# Patient Record
Sex: Female | Born: 1955 | Race: White | Hispanic: No | Marital: Married
Health system: Southern US, Community
[De-identification: ages and names within clinical notes are randomized; demographics above are authoritative.]

## PROBLEM LIST (undated history)

## (undated) DIAGNOSIS — E7800 Pure hypercholesterolemia, unspecified: Secondary | ICD-10-CM

## (undated) DIAGNOSIS — F419 Anxiety disorder, unspecified: Secondary | ICD-10-CM

---

## 2008-11-15 ENCOUNTER — Ambulatory Visit: Payer: Self-pay | Admitting: Family Medicine

## 2015-03-20 ENCOUNTER — Ambulatory Visit
Admit: 2015-03-20 | Disposition: A | Discharge status: Home / Self Care | Payer: Self-pay | Attending: Family Medicine | Admitting: Family Medicine

## 2015-04-08 ENCOUNTER — Ambulatory Visit
Admit: 2015-04-08 | Disposition: A | Discharge status: Home / Self Care | Payer: Self-pay | Attending: Family Medicine | Admitting: Family Medicine

## 2015-08-06 ENCOUNTER — Encounter: Payer: Self-pay | Admitting: *Deleted

## 2015-08-07 ENCOUNTER — Ambulatory Visit: Payer: Managed Care, Other (non HMO) | Admitting: Anesthesiology

## 2015-08-07 ENCOUNTER — Ambulatory Visit
Admission: RE | Admit: 2015-08-07 | Discharge: 2015-08-07 | Disposition: A | Discharge status: Home / Self Care | Payer: Managed Care, Other (non HMO) | Source: Ambulatory Visit | Attending: Gastroenterology | Admitting: Gastroenterology

## 2015-08-07 ENCOUNTER — Encounter: Payer: Self-pay | Admitting: Anesthesiology

## 2015-08-07 ENCOUNTER — Encounter
Admission: RE | Disposition: A | Discharge status: Home / Self Care | Payer: Self-pay | Source: Ambulatory Visit | Attending: Gastroenterology

## 2015-08-07 DIAGNOSIS — Z1211 Encounter for screening for malignant neoplasm of colon: Secondary | ICD-10-CM | POA: Diagnosis present

## 2015-08-07 DIAGNOSIS — Z79899 Other long term (current) drug therapy: Secondary | ICD-10-CM | POA: Diagnosis not present

## 2015-08-07 DIAGNOSIS — F419 Anxiety disorder, unspecified: Secondary | ICD-10-CM | POA: Insufficient documentation

## 2015-08-07 DIAGNOSIS — K640 First degree hemorrhoids: Secondary | ICD-10-CM | POA: Insufficient documentation

## 2015-08-07 DIAGNOSIS — E780 Pure hypercholesterolemia: Secondary | ICD-10-CM | POA: Diagnosis not present

## 2015-08-07 DIAGNOSIS — Z7982 Long term (current) use of aspirin: Secondary | ICD-10-CM | POA: Insufficient documentation

## 2015-08-07 HISTORY — DX: Pure hypercholesterolemia, unspecified: E78.00

## 2015-08-07 HISTORY — DX: Anxiety disorder, unspecified: F41.9

## 2015-08-07 HISTORY — PX: COLONOSCOPY WITH PROPOFOL: SHX5780

## 2015-08-07 SURGERY — COLONOSCOPY WITH PROPOFOL
Anesthesia: General

## 2015-08-07 MED ORDER — SODIUM CHLORIDE 0.9 % IV SOLN
INTRAVENOUS | Status: DC
  Administered 2015-08-07: 1000 mL via INTRAVENOUS

## 2015-08-07 MED ORDER — LIDOCAINE HCL (CARDIAC) 20 MG/ML IV SOLN
INTRAVENOUS | Status: DC | PRN
  Administered 2015-08-07: 60 mg via INTRAVENOUS

## 2015-08-07 MED ORDER — PROPOFOL INFUSION 10 MG/ML OPTIME
INTRAVENOUS | Status: DC | PRN
  Administered 2015-08-07: 140 ug/kg/min via INTRAVENOUS

## 2015-08-07 MED ORDER — SODIUM CHLORIDE 0.9 % IV SOLN: INTRAVENOUS | Status: DC

## 2015-08-07 MED ORDER — PROPOFOL 10 MG/ML IV BOLUS
INTRAVENOUS | Status: DC | PRN
  Administered 2015-08-07: 30 mg via INTRAVENOUS
  Administered 2015-08-07: 70 mg via INTRAVENOUS

## 2015-08-07 NOTE — Anesthesia Postprocedure Evaluation (Signed)
  Anesthesia Post-op Note  Patient: Katrina Perkins [name]  Procedure(s) Performed: Procedure(s): COLONOSCOPY WITH PROPOFOL (N/A)  Anesthesia type:General  Patient location: PACU  Post pain: Pain level controlled  Post assessment: Post-op Vital signs reviewed, Patient's Cardiovascular Status Stable, Respiratory Function Stable, Patent Airway and No signs of Nausea or vomiting  Post vital signs: Reviewed and stable  Last Vitals:  Filed Vitals:   08/07/15 1020  BP: 97/46  Pulse: 48  Temp:   Resp: 19    Level of consciousness: awake, alert  and patient cooperative  Complications: No apparent anesthesia complications

## 2015-08-07 NOTE — Op Note (Signed)
[facility] Gastroenterology Patient Name: Katrina Perkins Procedure Date: 08/07/2015 9:23 AM MRN: [id number] Account #: [id number] Date of Birth: July 28, 1956 Admit Type: Outpatient Age: 59 Room: [facility] ENDO ROOM 1 Gender: Female Note Status: Finalized Procedure:         Colonoscopy Indications:       Screening for colorectal malignant neoplasm, This is the                     patient's first colonoscopy Patient Profile:   This is a 59 year old female. Providers:         Rhona Raider. Shelle Iron, MD Referring MD:      Haynes Kerns (Referring MD) Medicines:         Propofol per Anesthesia Complications:     No immediate complications. Procedure:         Pre-Anesthesia Assessment:                    - Prior to the procedure, a History and Physical was                     performed, and patient medications, allergies and                     sensitivities were reviewed. The patient's tolerance of                     previous anesthesia was reviewed.                    After obtaining informed consent, the colonoscope was                     passed under direct vision. Throughout the procedure, the                     patient's blood pressure, pulse, and oxygen saturations                     were monitored continuously. The Colonoscope was                     introduced through the anus and advanced to the the cecum,                     identified by appendiceal orifice and ileocecal valve. The                     colonoscopy was performed without difficulty. The patient                     tolerated the procedure well. The quality of the bowel                     preparation was excellent. Findings:      The perianal and digital rectal examinations were normal.      Internal hemorrhoids were found during retroflexion. The hemorrhoids       were Grade I (internal hemorrhoids that do not prolapse).      The exam was otherwise without abnormality. Impression:        -  Internal hemorrhoids.                    - The examination was otherwise normal.                    -  No specimens collected. Recommendation:    - Observe patient in GI recovery unit.                    - High fiber diet.                    - Continue present medications.                    - Repeat colonoscopy in 10 years for screening purposes.                    - Return to referring physician.                    - The findings and recommendations were discussed with the                     patient.                    - The findings and recommendations were discussed with the                     patient's family. Procedure Code(s): --- Professional ---                    [phone], Colonoscopy, flexible; diagnostic, including                     collection of specimen(s) by brushing or washing, when                     performed (separate procedure) CPT copyright 2014 [employer]. All rights reserved. The codes documented in this report are preliminary and upon coder review may  be revised to meet current compliance requirements. Kathalene Frames, MD 08/07/2015 9:56:47 AM This report has been signed electronically. Number of Addenda: 0 Note Initiated On: 08/07/2015 9:23 AM Scope Withdrawal Time: 0 hours 10 minutes 1 second  Total Procedure Duration: 0 hours 15 minutes 55 seconds       [facility]

## 2015-08-07 NOTE — Transfer of Care (Signed)
Immediate Anesthesia Transfer of Care Note  Patient: Katrina Perkins [facility]  Procedure(s) Performed: Procedure(s): COLONOSCOPY WITH PROPOFOL (N/A)  Patient Location: PACU  Anesthesia Type:General  Level of Consciousness: awake  Airway & Oxygen Therapy: Patient Spontanous Breathing and Patient connected to nasal cannula oxygen  Post-op Assessment: Report given to RN and Post -op Vital signs reviewed and stable  Post vital signs: Reviewed and stable  Last Vitals:  Filed Vitals:   08/07/15 0959  BP: 96/56  Pulse: 45  Temp: 35.7 C  Resp: 16    Complications: No apparent anesthesia complications

## 2015-08-07 NOTE — Anesthesia Preprocedure Evaluation (Signed)
Anesthesia Evaluation  Patient identified by MRN, date of birth, ID band Patient awake    Reviewed: Allergy & Precautions, NPO status , Patient's Chart, lab work & pertinent test results, reviewed documented beta blocker date and time   Airway Mallampati: II  TM Distance: >3 FB     Dental  (+) Chipped   Pulmonary          Cardiovascular     Neuro/Psych Anxiety    GI/Hepatic   Endo/Other    Renal/GU      Musculoskeletal   Abdominal   Peds  Hematology   Anesthesia Other Findings   Reproductive/Obstetrics                             Anesthesia Physical Anesthesia Plan  ASA: II  Anesthesia Plan: General   Post-op Pain Management:    Induction: Intravenous  Airway Management Planned: Nasal Cannula  Additional Equipment:   Intra-op Plan:   Post-operative Plan:   Informed Consent: I have reviewed the patients History and Physical, chart, labs and discussed the procedure including the risks, benefits and alternatives for the proposed anesthesia with the patient or authorized representative who has indicated his/her understanding and acceptance.     Plan Discussed with: CRNA  Anesthesia Plan Comments:         Anesthesia Quick Evaluation

## 2015-08-07 NOTE — H&P (Signed)
  Primary Care Physician:  PROVIDER NOT IN SYSTEM  Pre-Procedure History & Physical: HPI:  Katrina Perkins is a 59 y.o. female is here for an colonoscopy.   Past Medical History  Diagnosis Date  . Anxiety   . High cholesterol     History reviewed. No pertinent past surgical history.  Prior to Admission medications   Medication Sig Start Date End Date Taking? Authorizing Provider  aspirin EC 81 MG tablet Take 81 mg by mouth daily.   Yes Historical Provider, MD  citalopram (CELEXA) 20 MG tablet Take 20 mg by mouth daily.   Yes Historical Provider, MD  simvastatin (ZOCOR) 20 MG tablet Take 20 mg by mouth daily.   Yes Historical Provider, MD    Allergies as of 07/15/2015  . (Not on File)    History reviewed. No pertinent family history.  Social History   Social History  . Marital Status: Married    Spouse Name: N/A  . Number of Children: N/A  . Years of Education: N/A   Occupational History  . Not on file.   Social History Main Topics  . Smoking status: Not on file  . Smokeless tobacco: Not on file  . Alcohol Use: Not on file  . Drug Use: Not on file  . Sexual Activity: Not on file   Other Topics Concern  . Not on file   Social History Narrative     Physical Exam: BP 118/59 mmHg  Pulse 60  Temp(Src) 97.3 F (36.3 C) (Tympanic)  Resp 16  Ht  (1.626 m)  Wt 60.328 kg (133 lb)  BMI 22.82 kg/m2  SpO2 100% General:   Alert,  pleasant and cooperative in NAD Head:  Normocephalic and atraumatic. Neck:  Supple; no masses or thyromegaly. Lungs:  Clear throughout to auscultation.    Heart:  Regular rate and rhythm. Abdomen:  Soft, nontender and nondistended. Normal bowel sounds, without guarding, and without rebound.   Neurologic:  Alert and  oriented x4;  grossly normal neurologically.  Impression/Plan: [name] [facility] is here for an colonoscopy to be performed for screening  Risks, benefits, limitations, and alternatives regarding  colonoscopy  have been reviewed with the patient.  Questions have been answered.  All parties agreeable.   Elnita Maxwell, MD  08/07/2015, 9:28 AM

## 2015-08-07 NOTE — Discharge Instructions (Signed)

## 2016-06-06 IMAGING — MG MM DIGITAL SCREENING BILAT W/ CAD
2 series · 2 of 2 positions shown · non-contrast
Comparison: None.

CLINICAL DATA: Screening.

EXAM:
DIGITAL SCREENING BILATERAL MAMMOGRAM WITH CAD

[L CC]
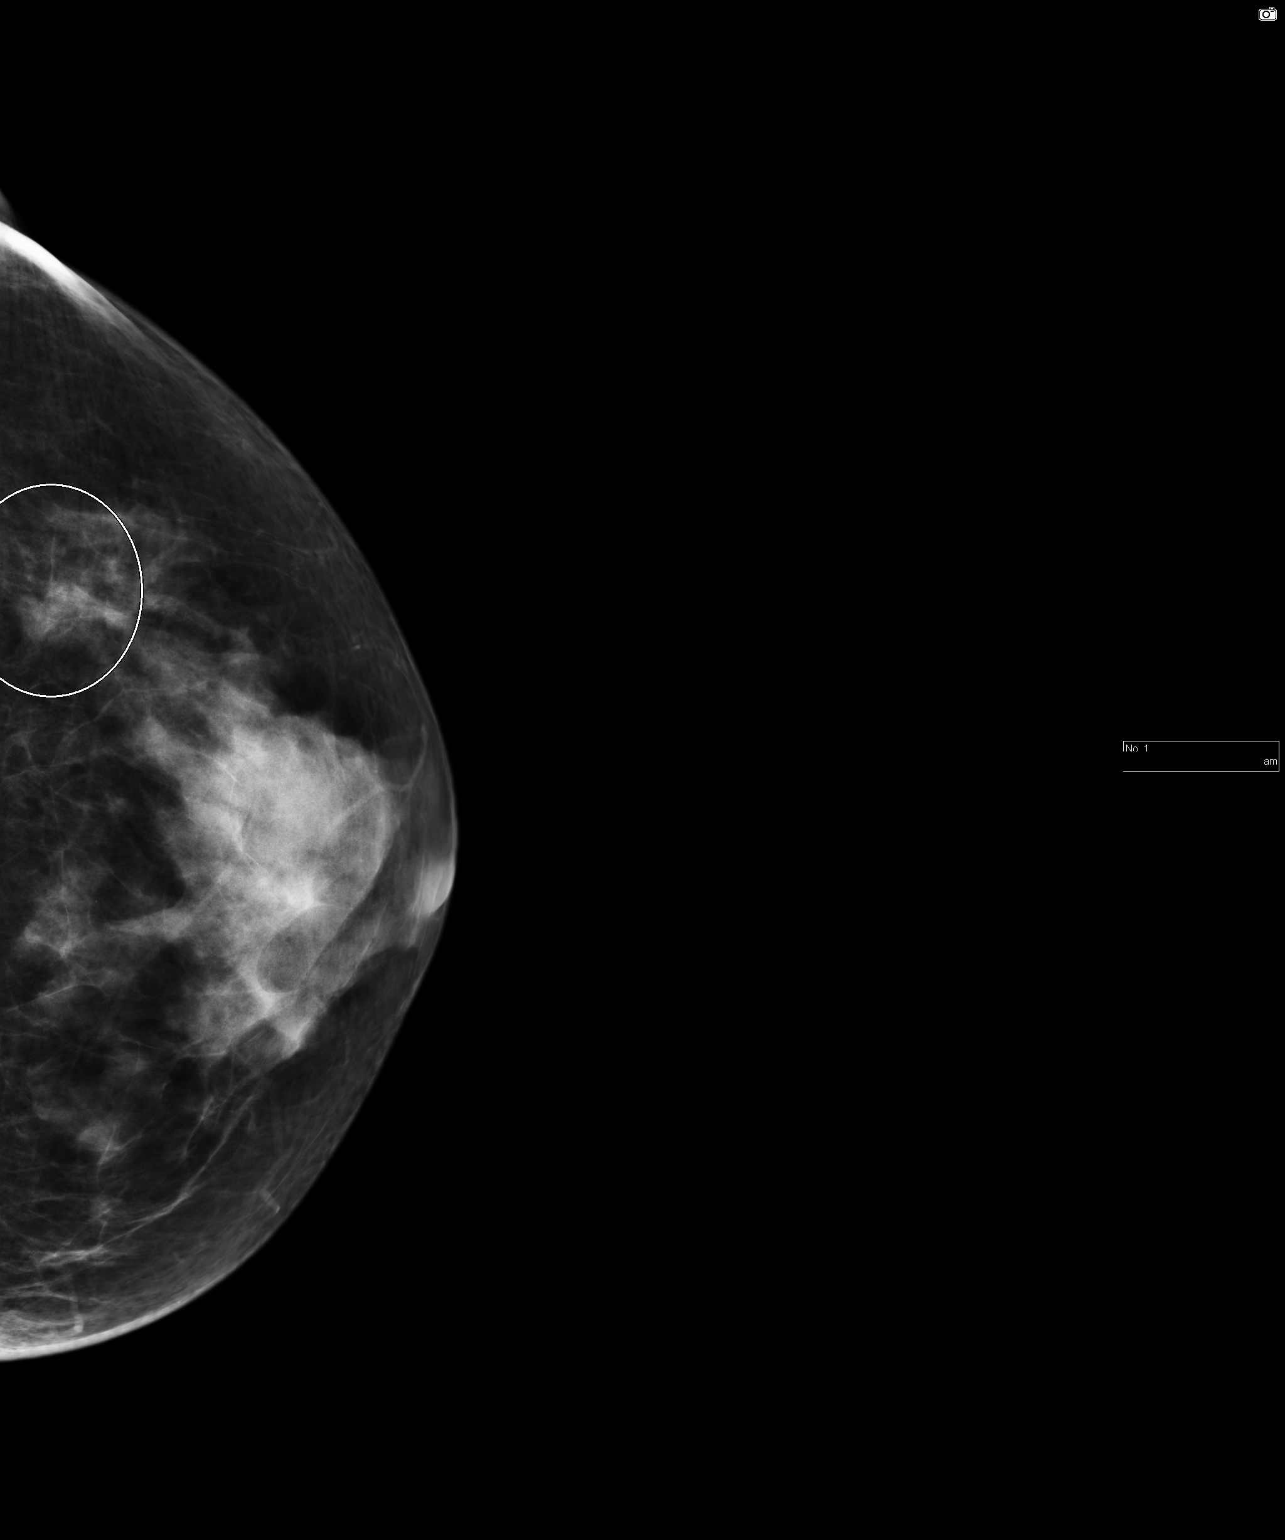

[L MLO]
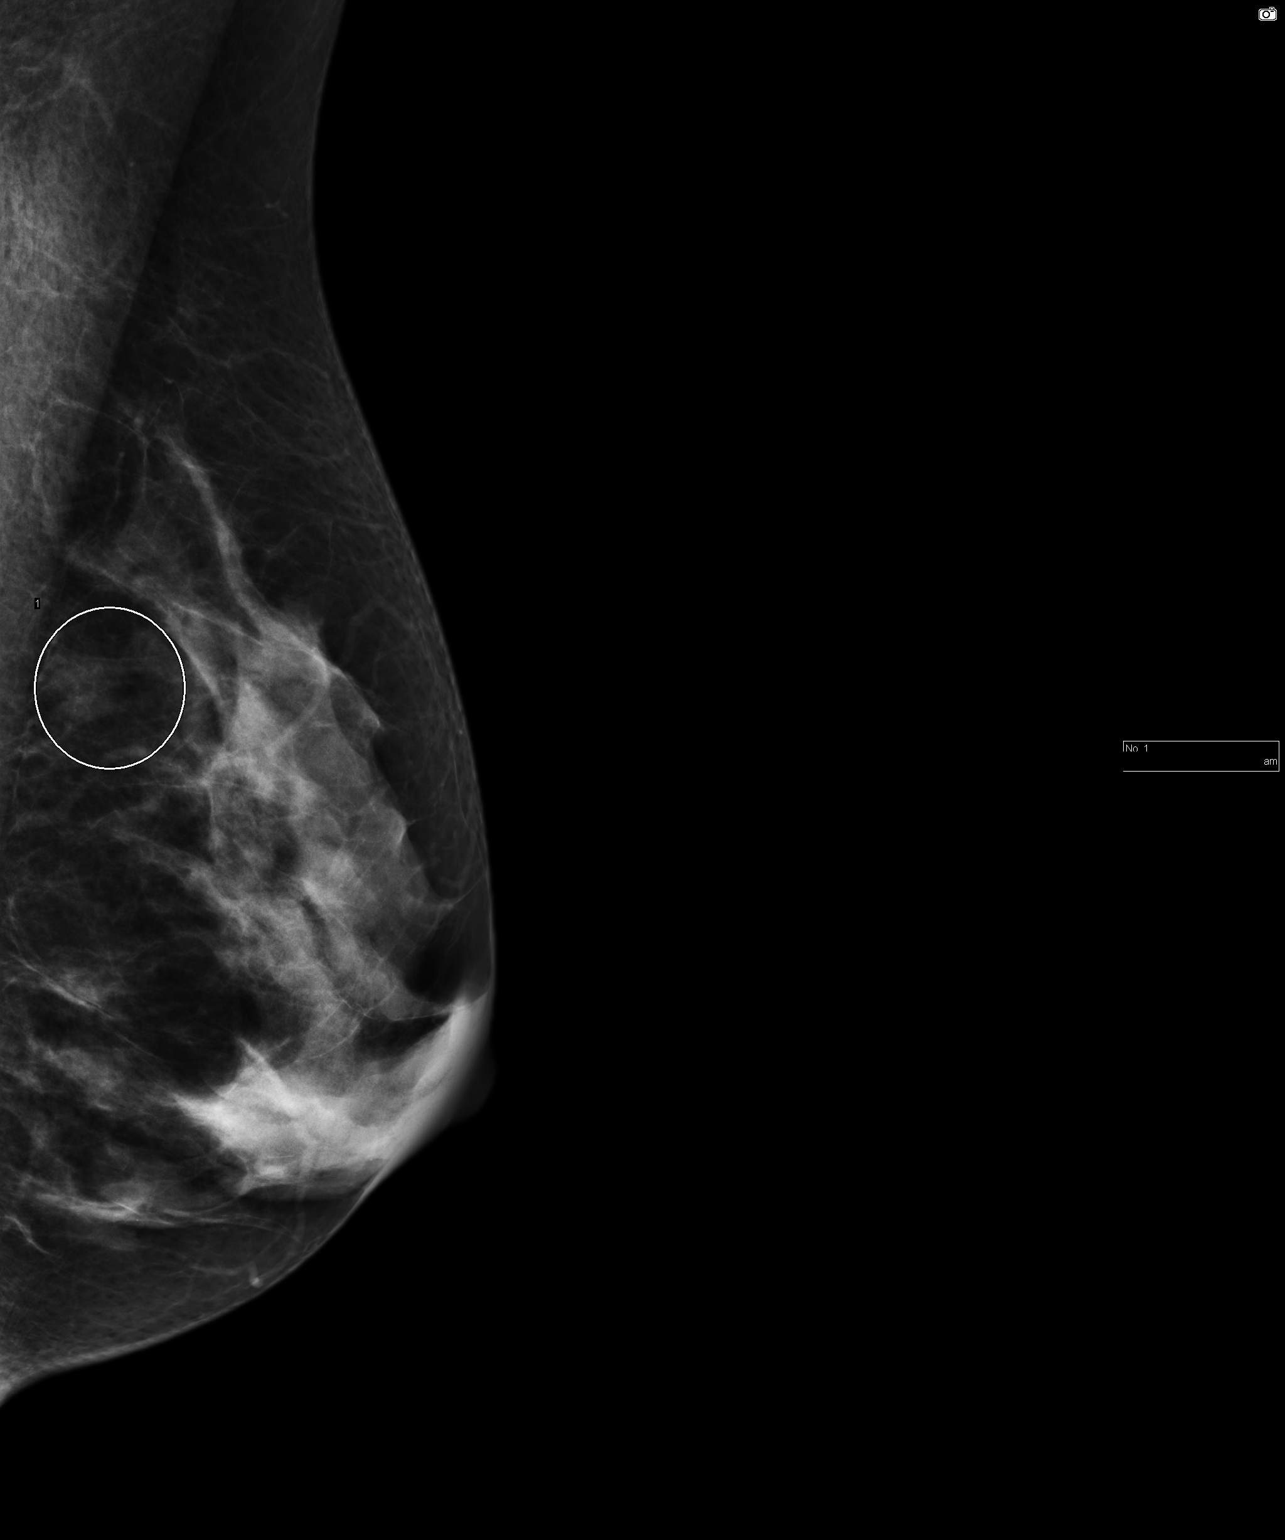

[2 of 2 positions shown; findings below may reference images not displayed]

ACR Breast Density Category c: The breast tissue is heterogeneously
dense, which may obscure small masses.
FINDINGS: In the left breast, a possible mass warrants further evaluation. In
the right breast, no findings suspicious for malignancy.

Images were processed with CAD.
IMPRESSION: Further evaluation is suggested for possible mass in the left
breast.

RECOMMENDATION:
Diagnostic mammogram and possibly ultrasound of the left breast.
(Code:Q3-R-LLD)

The patient will be contacted regarding the findings, and additional
imaging will be scheduled.

BI-RADS CATEGORY  0: Incomplete. Need additional imaging evaluation
and/or prior mammograms for comparison.

## 2017-07-04 ENCOUNTER — Other Ambulatory Visit: Payer: Self-pay | Admitting: Family Medicine

## 2017-07-04 DIAGNOSIS — Z1231 Encounter for screening mammogram for malignant neoplasm of breast: Secondary | ICD-10-CM

## 2017-07-19 ENCOUNTER — Ambulatory Visit
Admission: RE | Admit: 2017-07-19 | Discharge: 2017-07-19 | Disposition: A | Discharge status: Home / Self Care | Payer: Managed Care, Other (non HMO) | Source: Ambulatory Visit | Attending: Family Medicine | Admitting: Family Medicine

## 2017-07-19 DIAGNOSIS — Z1231 Encounter for screening mammogram for malignant neoplasm of breast: Secondary | ICD-10-CM | POA: Diagnosis present

## 2018-03-01 ENCOUNTER — Other Ambulatory Visit: Payer: Self-pay | Admitting: Family Medicine

## 2018-03-01 DIAGNOSIS — Z01419 Encounter for gynecological examination (general) (routine) without abnormal findings: Secondary | ICD-10-CM

## 2018-03-01 DIAGNOSIS — Z1231 Encounter for screening mammogram for malignant neoplasm of breast: Secondary | ICD-10-CM

## 2021-04-24 ENCOUNTER — Other Ambulatory Visit: Payer: Self-pay | Admitting: Pediatrics

## 2021-04-24 DIAGNOSIS — Z1231 Encounter for screening mammogram for malignant neoplasm of breast: Secondary | ICD-10-CM

## 2021-05-06 ENCOUNTER — Ambulatory Visit
Admission: RE | Admit: 2021-05-06 | Discharge: 2021-05-06 | Disposition: A | Discharge status: Home / Self Care | Payer: 59 | Source: Ambulatory Visit | Attending: Pediatrics | Admitting: Pediatrics

## 2021-05-06 ENCOUNTER — Other Ambulatory Visit: Payer: Self-pay

## 2021-05-06 DIAGNOSIS — Z1231 Encounter for screening mammogram for malignant neoplasm of breast: Secondary | ICD-10-CM | POA: Diagnosis not present

## 2022-07-24 IMAGING — MG MM DIGITAL SCREENING BILAT W/ TOMO AND CAD
8 series · 9 of 24 positions shown · non-contrast
Comparison: Previous exam(s).

CLINICAL DATA: Screening.

EXAM:
DIGITAL SCREENING BILATERAL MAMMOGRAM WITH TOMOSYNTHESIS AND CAD
TECHNIQUE: Bilateral screening digital craniocaudal and mediolateral oblique
mammograms were obtained. Bilateral screening digital breast
tomosynthesis was performed. The images were evaluated with
computer-aided detection.

[R MLO synth-2D]
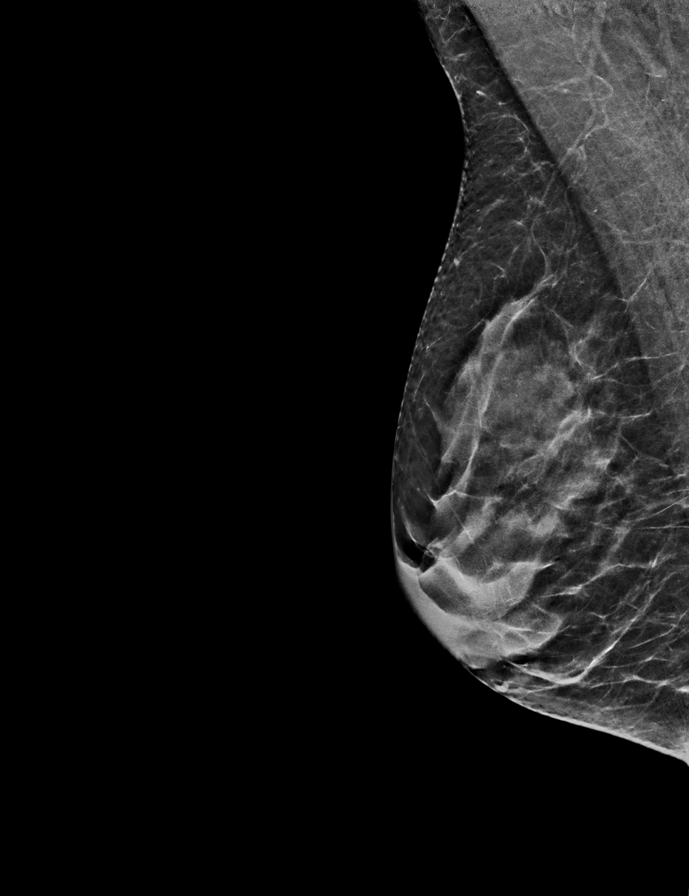

[R CC synth-2D]
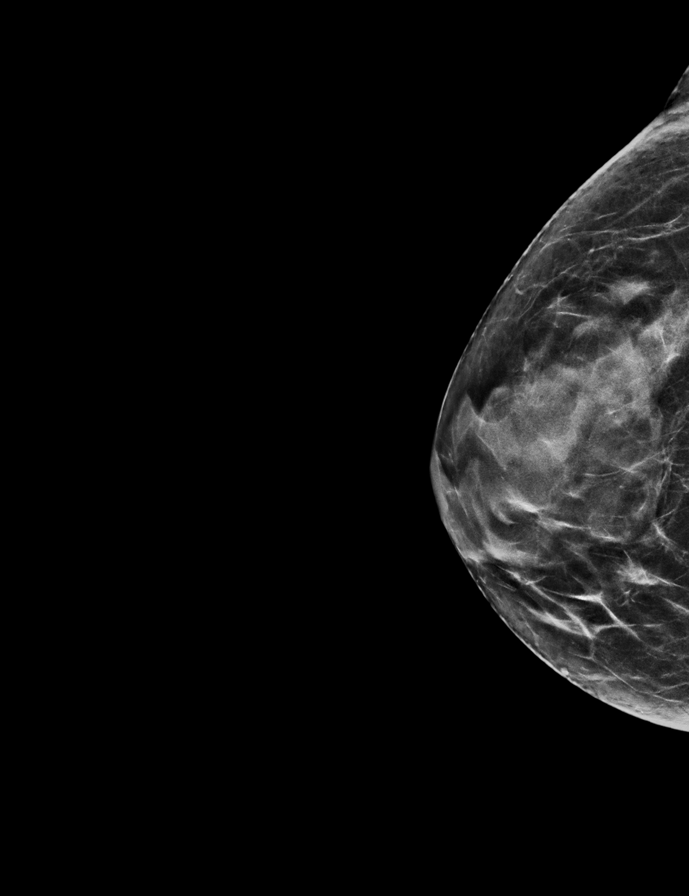

[L MLO synth-2D]
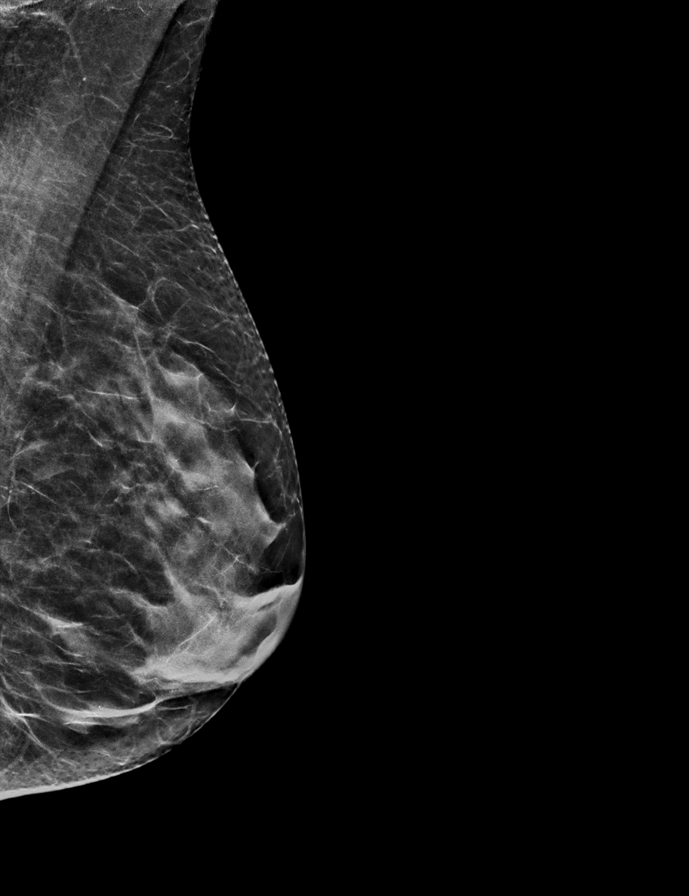

[L CC synth-2D]
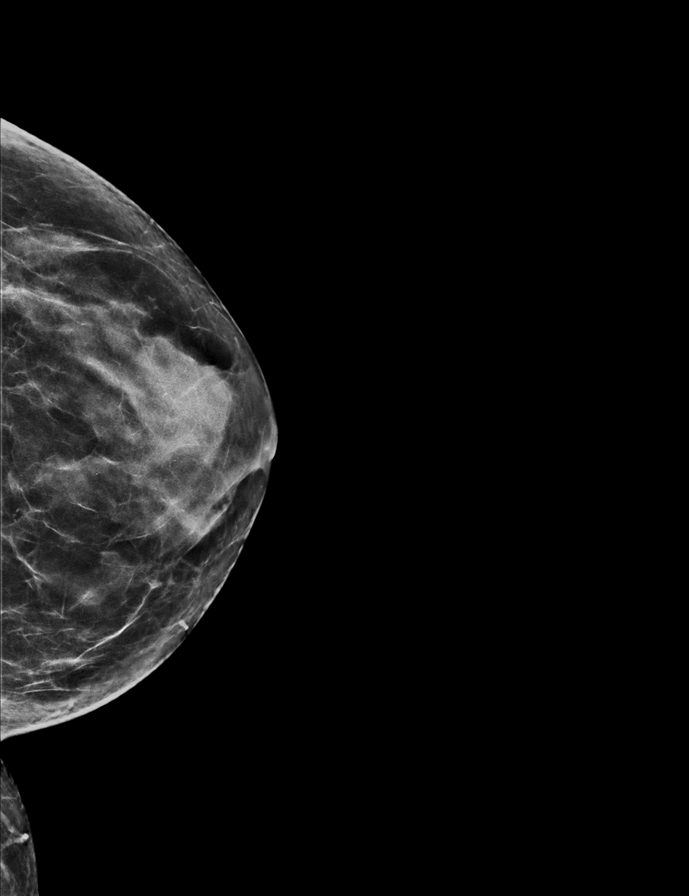

[R CC tomo · 2 of 57 frames shown]
[frame 19/57]
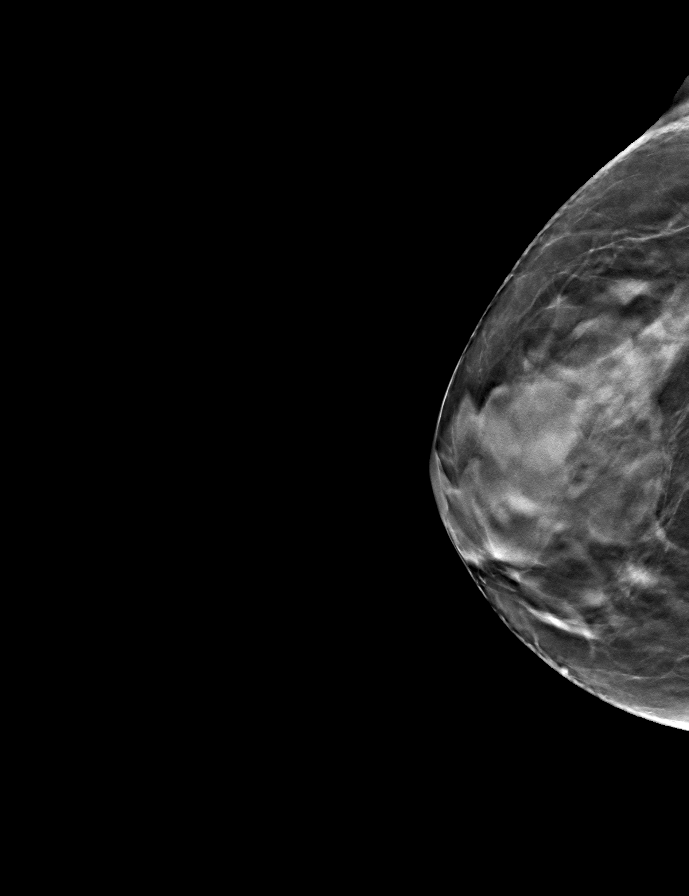
[frame 29/57]
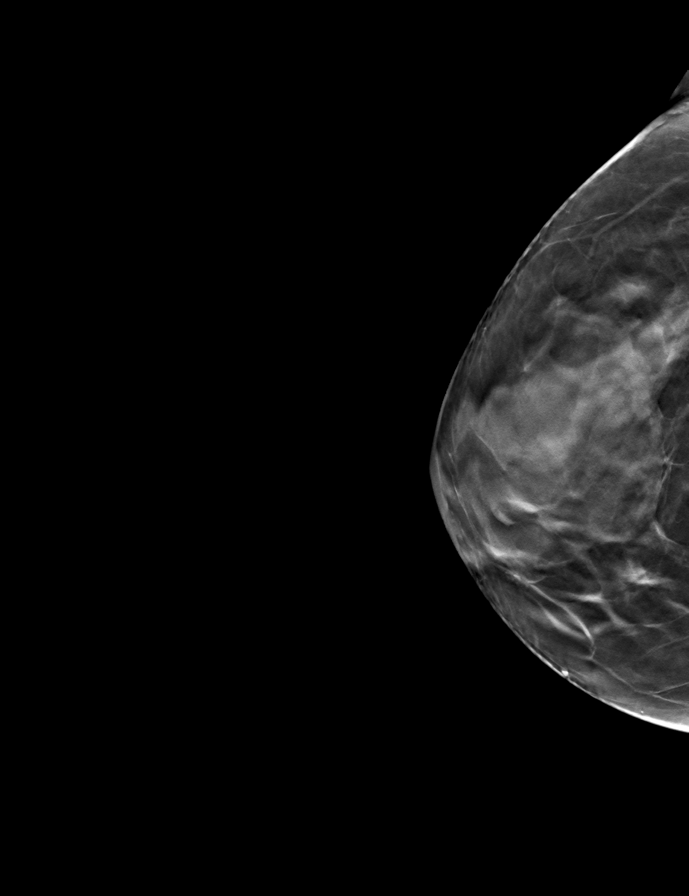

[L MLO tomo · tomo slice 27/52.0]
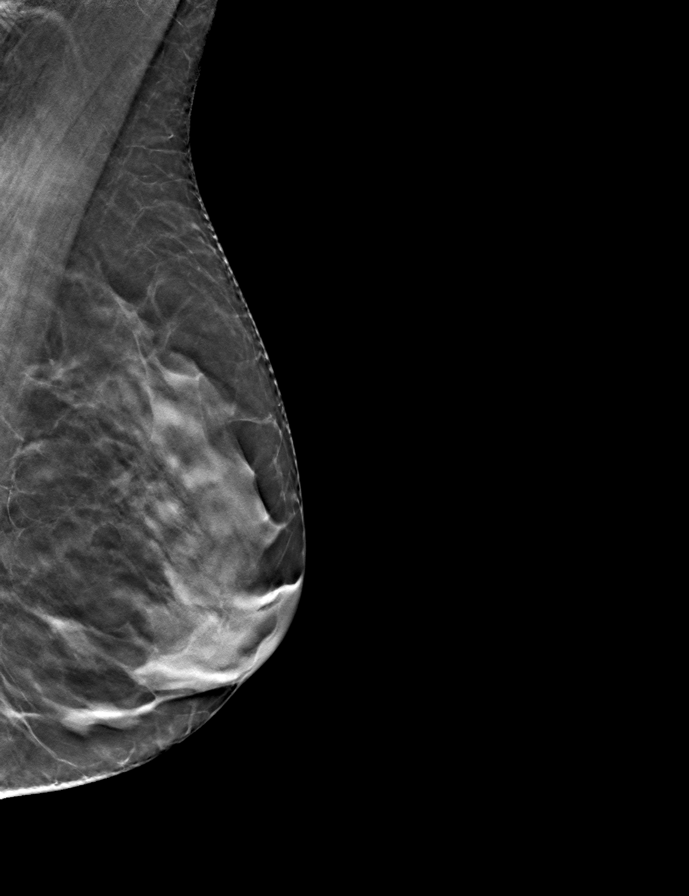

[L CC tomo · tomo slice 29/58.0]
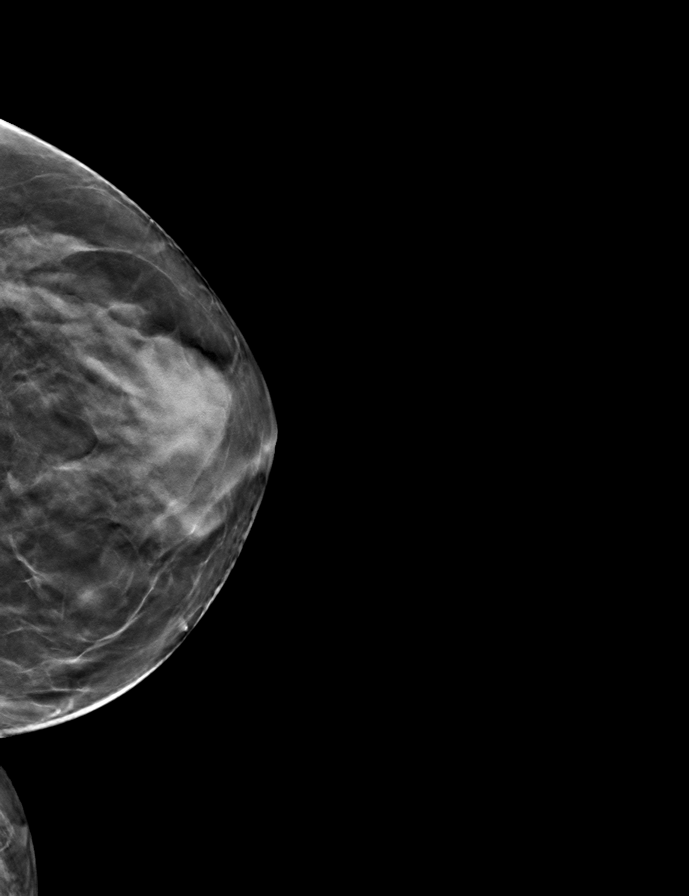

[R MLO tomo · tomo slice 27/52.0]
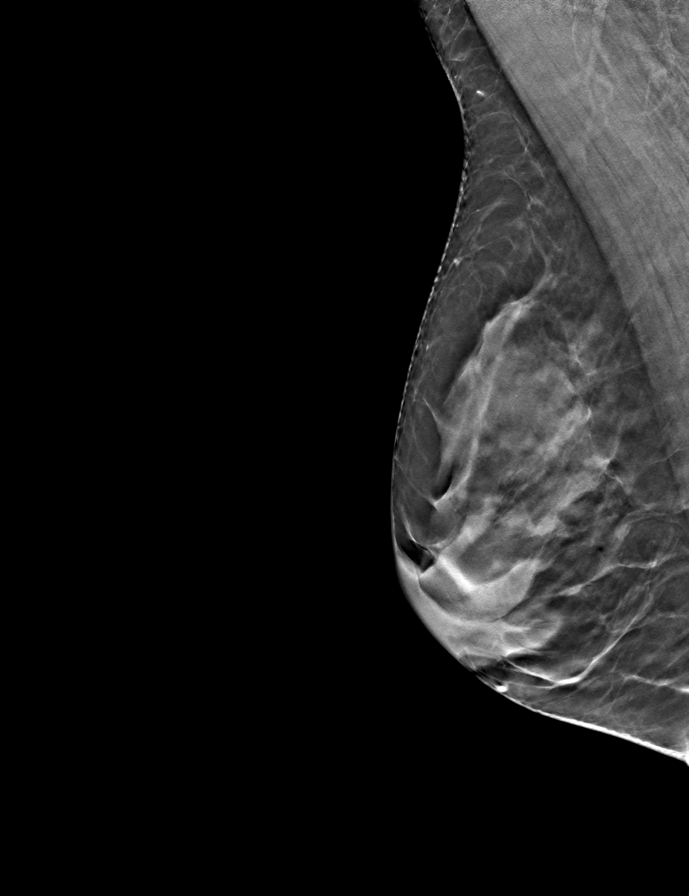

[9 of 24 positions shown; findings below may reference images not displayed]

ACR Breast Density Category c: The breast tissue is heterogeneously
dense, which may obscure small masses.
FINDINGS: There are no findings suspicious for malignancy. The images were
evaluated with computer-aided detection.
IMPRESSION: No mammographic evidence of malignancy. A result letter of this
screening mammogram will be mailed directly to the patient.

RECOMMENDATION:
Screening mammogram in one year. (Code:T4-5-GWO)

BI-RADS CATEGORY  1: Negative.

## 2023-04-19 ENCOUNTER — Other Ambulatory Visit: Payer: Self-pay | Admitting: Pediatrics

## 2023-04-19 DIAGNOSIS — Z780 Asymptomatic menopausal state: Secondary | ICD-10-CM

## 2023-04-19 DIAGNOSIS — Z1231 Encounter for screening mammogram for malignant neoplasm of breast: Secondary | ICD-10-CM

## 2023-05-17 ENCOUNTER — Ambulatory Visit
Admission: RE | Admit: 2023-05-17 | Discharge: 2023-05-17 | Disposition: A | Discharge status: Home / Self Care | Payer: 59 | Source: Ambulatory Visit | Attending: Pediatrics | Admitting: Pediatrics

## 2023-05-17 DIAGNOSIS — Z780 Asymptomatic menopausal state: Secondary | ICD-10-CM | POA: Diagnosis present

## 2023-05-17 DIAGNOSIS — Z1231 Encounter for screening mammogram for malignant neoplasm of breast: Secondary | ICD-10-CM

## 2024-11-12 ENCOUNTER — Other Ambulatory Visit: Payer: Self-pay | Admitting: Pediatrics

## 2024-11-12 DIAGNOSIS — R9430 Abnormal result of cardiovascular function study, unspecified: Secondary | ICD-10-CM

## 2024-11-12 DIAGNOSIS — E7800 Pure hypercholesterolemia, unspecified: Secondary | ICD-10-CM

## 2024-11-23 ENCOUNTER — Ambulatory Visit
Admission: RE | Admit: 2024-11-23 | Discharge: 2024-11-23 | Disposition: A | Payer: Self-pay | Source: Ambulatory Visit | Attending: Pediatrics | Admitting: Pediatrics

## 2024-11-23 ENCOUNTER — Ambulatory Visit

## 2024-11-23 DIAGNOSIS — R9430 Abnormal result of cardiovascular function study, unspecified: Secondary | ICD-10-CM

## 2024-11-23 DIAGNOSIS — E7800 Pure hypercholesterolemia, unspecified: Secondary | ICD-10-CM
